# Patient Record
Sex: Female | Born: 1975 | Race: White | Hispanic: No | Marital: Married | Smoking: Current every day smoker
Health system: Southern US, Community
[De-identification: ages and names within clinical notes are randomized; demographics above are authoritative.]

## PROBLEM LIST (undated history)

## (undated) DIAGNOSIS — J45909 Unspecified asthma, uncomplicated: Secondary | ICD-10-CM

## (undated) DIAGNOSIS — J449 Chronic obstructive pulmonary disease, unspecified: Secondary | ICD-10-CM

## (undated) HISTORY — PX: CHOLECYSTECTOMY: SHX55

## (undated) HISTORY — PX: TUBAL LIGATION: SHX77

## (undated) HISTORY — PX: TONSILLECTOMY: SUR1361

---

## 2016-11-10 ENCOUNTER — Emergency Department (HOSPITAL_BASED_OUTPATIENT_CLINIC_OR_DEPARTMENT_OTHER)
Admission: EM | Admit: 2016-11-10 | Discharge: 2016-11-10 | Disposition: A | Discharge status: Home / Self Care | Payer: Self-pay | Attending: Emergency Medicine | Admitting: Emergency Medicine

## 2016-11-10 ENCOUNTER — Emergency Department (HOSPITAL_BASED_OUTPATIENT_CLINIC_OR_DEPARTMENT_OTHER): Payer: Self-pay

## 2016-11-10 ENCOUNTER — Encounter (HOSPITAL_BASED_OUTPATIENT_CLINIC_OR_DEPARTMENT_OTHER): Payer: Self-pay | Admitting: *Deleted

## 2016-11-10 DIAGNOSIS — J40 Bronchitis, not specified as acute or chronic: Secondary | ICD-10-CM

## 2016-11-10 DIAGNOSIS — J45909 Unspecified asthma, uncomplicated: Secondary | ICD-10-CM | POA: Insufficient documentation

## 2016-11-10 DIAGNOSIS — F17200 Nicotine dependence, unspecified, uncomplicated: Secondary | ICD-10-CM | POA: Insufficient documentation

## 2016-11-10 DIAGNOSIS — J209 Acute bronchitis, unspecified: Secondary | ICD-10-CM | POA: Insufficient documentation

## 2016-11-10 DIAGNOSIS — J449 Chronic obstructive pulmonary disease, unspecified: Secondary | ICD-10-CM | POA: Insufficient documentation

## 2016-11-10 HISTORY — DX: Chronic obstructive pulmonary disease, unspecified: J44.9

## 2016-11-10 HISTORY — DX: Unspecified asthma, uncomplicated: J45.909

## 2016-11-10 MED ORDER — AZITHROMYCIN 250 MG PO TABS: 500.0000 mg | ORAL_TABLET | Freq: Every day | ORAL | 0 refills | Status: AC

## 2016-11-10 MED ORDER — ALBUTEROL SULFATE (2.5 MG/3ML) 0.083% IN NEBU
5.0000 mg | INHALATION_SOLUTION | Freq: Once | RESPIRATORY_TRACT | Status: AC
  Administered 2016-11-10: 5 mg via RESPIRATORY_TRACT
  Filled 2016-11-10: qty 6

## 2016-11-10 MED ORDER — PROMETHAZINE-DM 6.25-15 MG/5ML PO SYRP: 1.2500 mL | ORAL_SOLUTION | Freq: Four times a day (QID) | ORAL | 0 refills | Status: AC | PRN

## 2016-11-10 MED ORDER — PREDNISONE 10 MG PO TABS: 40.0000 mg | ORAL_TABLET | Freq: Every day | ORAL | 0 refills | Status: AC

## 2016-11-10 MED ORDER — IPRATROPIUM-ALBUTEROL 0.5-2.5 (3) MG/3ML IN SOLN
3.0000 mL | Freq: Four times a day (QID) | RESPIRATORY_TRACT | Status: DC
  Administered 2016-11-10: 3 mL via RESPIRATORY_TRACT
  Filled 2016-11-10: qty 3

## 2016-11-10 NOTE — Discharge Instructions (Signed)
Take azithromycin for 3 days. Take prednisone for 5 days. Take cough medication as needed. Follow-up with PCP for further evaluation. Return to ED for worsening cough, trouble breathing, chest pain, high fever.
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
## 2016-11-10 NOTE — ED Triage Notes (Signed)
Pt c/o URI symptoms  X 2 weeks also c/o left wrist pain which radiates up to elbow
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
